# Patient Record
Sex: Male | Born: 1985 | Hispanic: Yes | Marital: Single | Smoking: Never smoker
Health system: Southern US, Community
[De-identification: ages and names within clinical notes are randomized; demographics above are authoritative.]

---

## 2018-07-13 ENCOUNTER — Other Ambulatory Visit: Payer: Self-pay

## 2018-07-13 ENCOUNTER — Encounter (HOSPITAL_COMMUNITY): Payer: Self-pay | Admitting: Emergency Medicine

## 2018-07-13 ENCOUNTER — Emergency Department (HOSPITAL_COMMUNITY): Payer: Medicaid Other

## 2018-07-13 ENCOUNTER — Emergency Department (HOSPITAL_COMMUNITY)
Admission: EM | Admit: 2018-07-13 | Discharge: 2018-07-13 | Disposition: A | Discharge status: Home / Self Care | Payer: Medicaid Other | Attending: Emergency Medicine | Admitting: Emergency Medicine

## 2018-07-13 DIAGNOSIS — R059 Cough, unspecified: Secondary | ICD-10-CM

## 2018-07-13 DIAGNOSIS — R05 Cough: Secondary | ICD-10-CM | POA: Insufficient documentation

## 2018-07-13 MED ORDER — GUAIFENESIN 100 MG/5ML PO LIQD: 100.0000 mg | ORAL | 0 refills | Status: AC | PRN

## 2018-07-13 NOTE — ED Provider Notes (Signed)
[facility] EMERGENCY DEPARTMENT Provider Note   CSN: [id number] Arrival date & time: 07/13/18  1727   History   Chief Complaint Chief Complaint  Patient presents with  . Cough    HPI Douglas Solis is a 32 y.o. male.  HPI   31 year old male presents today with cough.  Patient notes over the last 3 weeks he has had productive cough.  He notes initially had rhinorrhea nasal congestion and fever.  He notes no longer having any upper respiratory symptoms but notes that he continues to have the productive cough.  He feels like mucus is getting stuck in his throat and he is unable to get it out.  Patient denies any chest pain or significant shortness of breath.  He notes his significant other is sick with similar symptoms.  No medications prior to arrival.   History reviewed. No pertinent past medical history.  There are no active problems to display for this patient.   History reviewed. No pertinent surgical history.     Home Medications    Prior to Admission medications   Medication Sig Start Date End Date Taking? Authorizing Provider  guaiFENesin (ROBITUSSIN) 100 MG/5ML liquid Take 5-10 mLs (100-200 mg total) by mouth every 4 (four) hours as needed for cough. 07/13/18   Eyvonne Mechanic, PA-C    Family History No family history on file.  Social History Social History   Tobacco Use  . Smoking status: Never Smoker  . Smokeless tobacco: Never Used  Substance Use Topics  . Alcohol use: Yes  . Drug use: Never    Allergies   Patient has no known allergies.   Review of Systems Review of Systems  All other systems reviewed and are negative.  Physical Exam Updated Vital Signs BP (!) 168/89 (BP Location: Right Wrist)   Pulse 96   Temp 98.7 F (37.1 C) (Oral)   Resp 20   SpO2 100%   Physical Exam  Constitutional: He is oriented to person, place, and time. He appears well-developed and well-nourished.  HENT:  Head: Normocephalic and  atraumatic.  Eyes: Pupils are equal, round, and reactive to light. Conjunctivae are normal. Right eye exhibits no discharge. Left eye exhibits no discharge. No scleral icterus.  Neck: Normal range of motion. No JVD present. No tracheal deviation present.  Cardiovascular: Normal rate, regular rhythm, normal heart sounds and intact distal pulses. Exam reveals no gallop and no friction rub.  No murmur heard. Pulmonary/Chest: Effort normal and breath sounds normal. No stridor. No respiratory distress. He has no wheezes. He has no rales. He exhibits no tenderness.  Neurological: He is alert and oriented to person, place, and time. Coordination normal.  Psychiatric: He has a normal mood and affect. His behavior is normal. Judgment and thought content normal.  Nursing note and vitals reviewed.   ED Treatments / Results  Labs (all labs ordered are listed, but only abnormal results are displayed) Labs Reviewed - No data to display  EKG None  Radiology No results found.  Procedures Procedures (including critical care time)  Medications Ordered in ED Medications - No data to display   Initial Impression / Assessment and Plan / ED Course  I have reviewed the triage vital signs and the nursing notes.  Pertinent labs & imaging results that were available during my care of the patient were reviewed by me and considered in my medical decision making (see chart for details).      Labs:   Imaging: DG  chest 2 view  Consults:  Therapeutics:  Discharge Meds: Tessalon, Mucinex  Assessment/Plan: 32 year old male presents today with cough.  This has been 3 weeks in nature.  He does appear to be very well with no fever clear lung sounds and 100% oxygen saturation.  Due to patient's ongoing symptoms chest x-ray will be ordered low suspicion for acute bacterial etiology.  Chest x-ray shows no acute findings anticipate discharge home with cough medication, Mucinex and outpatient  follow-up.    Final Clinical Impressions(s) / ED Diagnoses   Final diagnoses:  Cough    ED Discharge Orders         Ordered    guaiFENesin (ROBITUSSIN) 100 MG/5ML liquid  Every 4 hours PRN     07/13/18 2138           Eyvonne Mechanic, PA-C 07/13/18 2156    Tegeler, Canary Brim, MD 07/13/18 2210

## 2018-07-13 NOTE — ED Triage Notes (Signed)
Patient reports persistent productive cough with chest congestion unrelieved by OTC medications , denies fever or chills /respirations unlabored .

## 2018-07-13 NOTE — ED Notes (Signed)
Pt stable, ambulatory, states understanding of discharge instructions 

## 2018-07-13 NOTE — Discharge Instructions (Addendum)
Please read attached information. If you experience any new or worsening signs or symptoms please return to the emergency room for evaluation. Please follow-up with your primary care provider or specialist as discussed. Please use medication prescribed only as directed and discontinue taking if you have any concerning signs or symptoms.   °

## 2019-03-04 IMAGING — DX DG CHEST 2V
2 series · 2 of 2 positions shown · non-contrast
Comparison: None.

CLINICAL DATA: Productive cough with chest congestion.

EXAM:
CHEST - 2 VIEW

[chest pa]
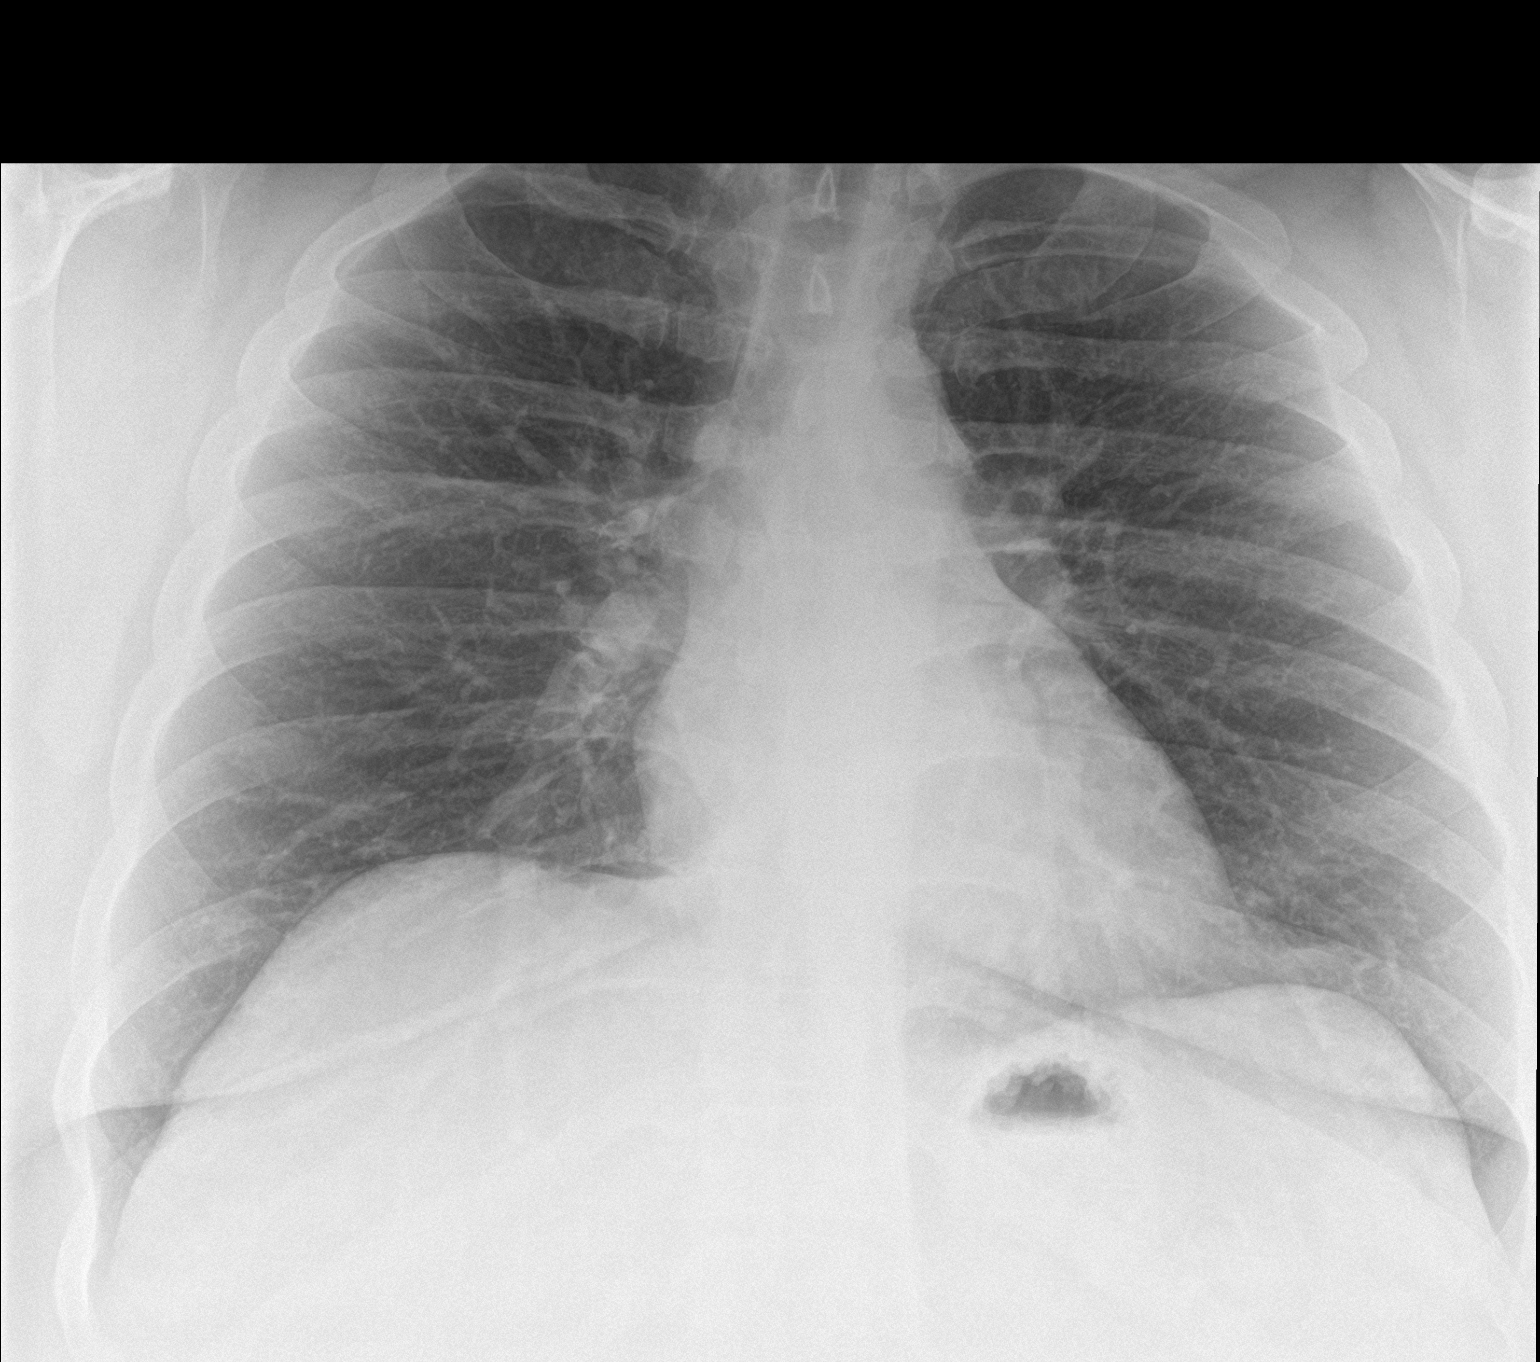

[chest lat]
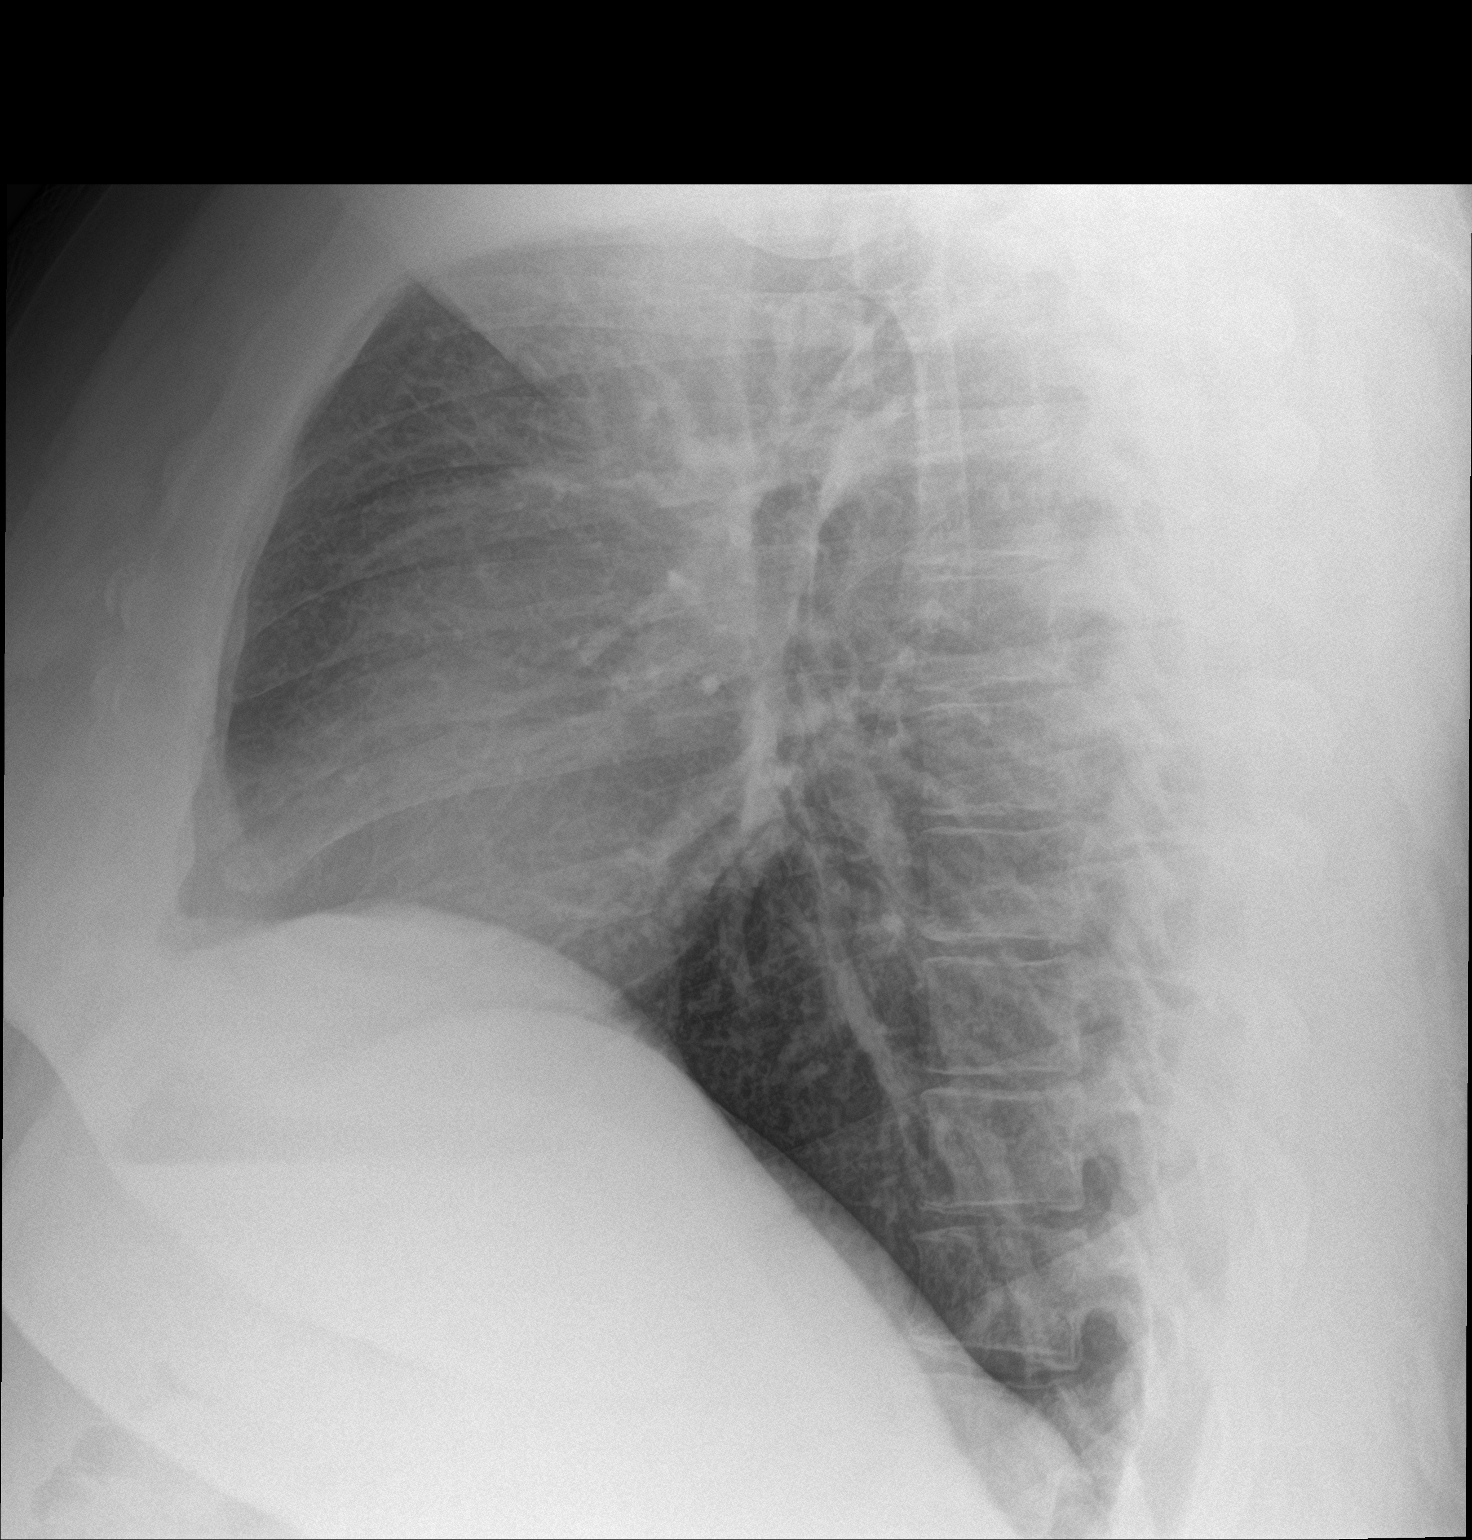

[2 of 2 positions shown; findings below may reference images not displayed]

FINDINGS: Lungs are clear. Cardiomediastinal silhouette, bones and soft
tissues are normal.
IMPRESSION: No active cardiopulmonary disease.

## 2019-09-11 ENCOUNTER — Ambulatory Visit: Payer: Medicaid Other | Attending: Internal Medicine

## 2019-09-11 DIAGNOSIS — Z20822 Contact with and (suspected) exposure to covid-19: Secondary | ICD-10-CM

## 2019-09-12 LAB — NOVEL CORONAVIRUS, NAA: SARS-CoV-2, NAA: NOT DETECTED
